# Patient Record
Sex: Male | Born: 1995 | Race: White | Hispanic: No | Marital: Single | Smoking: Former smoker
Health system: Southern US, Community
[De-identification: ages and names within clinical notes are randomized; demographics above are authoritative.]

---
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
## 2018-08-31 ENCOUNTER — Emergency Department
Admission: EM | Admit: 2018-08-31 | Discharge: 2018-08-31 | Disposition: A | Discharge status: Home / Self Care | Payer: Self-pay | Attending: Emergency Medicine | Admitting: Emergency Medicine

## 2018-08-31 ENCOUNTER — Other Ambulatory Visit: Payer: Self-pay

## 2018-08-31 DIAGNOSIS — Z5321 Procedure and treatment not carried out due to patient leaving prior to being seen by health care provider: Secondary | ICD-10-CM | POA: Insufficient documentation

## 2018-08-31 DIAGNOSIS — S01511A Laceration without foreign body of lip, initial encounter: Secondary | ICD-10-CM | POA: Insufficient documentation

## 2018-08-31 DIAGNOSIS — S01111A Laceration without foreign body of right eyelid and periocular area, initial encounter: Secondary | ICD-10-CM | POA: Insufficient documentation

## 2018-08-31 DIAGNOSIS — Y998 Other external cause status: Secondary | ICD-10-CM | POA: Insufficient documentation

## 2018-08-31 DIAGNOSIS — Y9389 Activity, other specified: Secondary | ICD-10-CM | POA: Insufficient documentation

## 2018-08-31 DIAGNOSIS — Y929 Unspecified place or not applicable: Secondary | ICD-10-CM | POA: Insufficient documentation

## 2018-08-31 NOTE — ED Notes (Signed)
Spoke with Dr. Forbach regarding patient, no orders at this time. 

## 2018-08-31 NOTE — ED Notes (Signed)
Pt ambulatory from department waiting area, followed by friends. This rn asked friends if pt was leaving, friends state "I think so, we're gonna find out".
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
## 2019-06-23 ENCOUNTER — Other Ambulatory Visit: Payer: Self-pay

## 2019-06-23 DIAGNOSIS — Z5321 Procedure and treatment not carried out due to patient leaving prior to being seen by health care provider: Secondary | ICD-10-CM | POA: Insufficient documentation

## 2019-06-23 DIAGNOSIS — J029 Acute pharyngitis, unspecified: Secondary | ICD-10-CM | POA: Insufficient documentation

## 2019-06-23 LAB — GROUP A STREP BY PCR: Group A Strep by PCR: DETECTED — AB

## 2019-06-23 NOTE — ED Triage Notes (Signed)
Pt arrives to ED via POV from home with c/o sore throat x1 day. Pt denies any c/o fever, no N/V/D. Pt denies taking any OTC medications PTA. Pt denies any c/o trouble swallowing, breathing, or managing oral secretions. Pt smells HEAVILY of marijuana smoke; RR even, regular, and unlabored.

## 2019-06-24 ENCOUNTER — Telehealth: Payer: Self-pay | Admitting: Emergency Medicine

## 2019-06-24 ENCOUNTER — Emergency Department
Admission: EM | Admit: 2019-06-24 | Discharge: 2019-06-24 | Disposition: A | Discharge status: Home / Self Care | Payer: Self-pay | Attending: Emergency Medicine | Admitting: Emergency Medicine

## 2019-06-24 NOTE — ED Notes (Signed)
No answer when called several times from lobby 

## 2019-06-24 NOTE — Telephone Encounter (Signed)
Called patient due to lwot to inquire about condition and follow up plans.  No answer and no voicemail.  His strep is positive.

## 2019-11-18 ENCOUNTER — Telehealth: Payer: Self-pay

## 2019-11-18 ENCOUNTER — Other Ambulatory Visit: Payer: Self-pay

## 2019-11-18 ENCOUNTER — Ambulatory Visit: Payer: Self-pay

## 2019-11-18 NOTE — Telephone Encounter (Signed)
TC from patient.  Requesting appt for tx. GF treated this am for chlamydia.  Appt scheduled Richmond Campbell, RN

## 2019-11-19 ENCOUNTER — Ambulatory Visit: Payer: Self-pay | Admitting: Family Medicine

## 2019-11-19 ENCOUNTER — Ambulatory Visit: Payer: Self-pay

## 2019-11-19 ENCOUNTER — Encounter: Payer: Self-pay | Admitting: Family Medicine

## 2019-11-19 DIAGNOSIS — Z202 Contact with and (suspected) exposure to infections with a predominantly sexual mode of transmission: Secondary | ICD-10-CM

## 2019-11-19 DIAGNOSIS — Z113 Encounter for screening for infections with a predominantly sexual mode of transmission: Secondary | ICD-10-CM

## 2019-11-19 LAB — GRAM STAIN

## 2019-11-19 MED ORDER — AZITHROMYCIN 500 MG PO TABS
1000.0000 mg | ORAL_TABLET | Freq: Once | ORAL | Status: AC
  Administered 2019-11-19: 15:00:00 1000 mg via ORAL

## 2019-11-19 NOTE — Progress Notes (Signed)
  [facility] Department STI clinic/screening visit  Subjective:  Nicodemus Denk is a 24 y.o. male being seen today for  Chief Complaint  Patient presents with  . SEXUALLY TRANSMITTED DISEASE     The patient reports they do not have symptoms.   Patient has the following medical conditions:  There are no problems to display for this patient.   HPI  Pt reports he is a contact to chlamydia. He would like treatment and STI screening. Denies symptoms.   See flowsheet for further details and programmatic requirements.    No components found for: HCV  The following portions of the patient's history were reviewed and updated as appropriate: allergies, current medications, past medical history, past social history, past surgical history and problem list.  Objective:  There were no vitals filed for this visit.   Physical Exam Constitutional:      Appearance: Normal appearance.  HENT:     Head: Normocephalic and atraumatic.     Comments: No nits or hair loss    Mouth/Throat:     Mouth: Mucous membranes are moist.     Pharynx: Oropharynx is clear. No oropharyngeal exudate or posterior oropharyngeal erythema.  Pulmonary:     Effort: Pulmonary effort is normal.  Abdominal:     General: Abdomen is flat.     Palpations: Abdomen is soft. There is no hepatomegaly or mass.     Tenderness: There is no abdominal tenderness.  Genitourinary:    Pubic Area: No rash or pubic lice.      Penis: Normal and uncircumcised.      Testes: Normal.     Epididymis:     Right: Normal.     Left: Normal.     Rectum: Normal.  Lymphadenopathy:     Head:     Right side of head: No preauricular or posterior auricular adenopathy.     Left side of head: No preauricular or posterior auricular adenopathy.     Cervical: No cervical adenopathy.     Upper Body:     Right upper body: No supraclavicular or axillary adenopathy.     Left upper body: No supraclavicular or axillary adenopathy.   Lower Body: No right inguinal adenopathy. No left inguinal adenopathy.  Skin:    General: Skin is warm and dry.     Findings: No rash.  Neurological:     Mental Status: He is alert and oriented to person, place, and time.       Assessment and Plan:  Garreth Burnsworth is a 24 y.o. male presenting to the [facility] Department for STI screening   1. Screening examination for venereal disease -Pt without symptoms. Screenings today as below. Treat gram stain per standing order. -Patient does meet criteria for HepB, HepC Screening. Accepts these screenings. -Counseled on warning s/sx and when to seek care. Recommended condom use with all sex and discussed importance of condom use for STI prevention. - Gram stain - Gonococcus culture - HCV [facility] - HBV Antigen/[facility]ibody [facility] - Syphilis Serology, [facility]  2. Chlamydia contact -Treatment today as below.  -Pt counseled regarding medication, including to RTC if vomits < 2 hr after taking medicine. No known allergies to this medication. -Advised no sex for 7 days after both pt and partner completes treatment and encouraged condoms with all sex. - azithromycin (ZITHROMAX) tablet 1,000 mg   Return for screening as needed.  No future appointments.  Ann Held, PA-C

## 2019-11-19 NOTE — Progress Notes (Signed)
Here today for STD screening. Accepts bloodwork. Amaira Safley, RN ° °

## 2019-11-19 NOTE — Progress Notes (Signed)
Gram Stain results reviewed. Patient treated per provider orders. Barclay Lennox, RN  

## 2019-11-23 LAB — GONOCOCCUS CULTURE

## 2019-11-24 LAB — HEPATITIS B SURFACE ANTIGEN

## 2019-11-25 LAB — HM HIV SCREENING LAB: HM HIV Screening: NEGATIVE

## 2019-11-25 LAB — HM HEPATITIS C SCREENING LAB: HM Hepatitis Screen: NEGATIVE

## 2020-12-20 ENCOUNTER — Encounter: Payer: Self-pay | Admitting: Emergency Medicine

## 2020-12-20 ENCOUNTER — Ambulatory Visit
Admission: EM | Admit: 2020-12-20 | Discharge: 2020-12-20 | Disposition: A | Discharge status: Home / Self Care | Payer: BLUE CROSS/BLUE SHIELD | Attending: Physician Assistant | Admitting: Physician Assistant

## 2020-12-20 ENCOUNTER — Other Ambulatory Visit: Payer: Self-pay

## 2020-12-20 DIAGNOSIS — M546 Pain in thoracic spine: Secondary | ICD-10-CM

## 2020-12-20 MED ORDER — CYCLOBENZAPRINE HCL 10 MG PO TABS: 10.0000 mg | ORAL_TABLET | Freq: Three times a day (TID) | ORAL | 0 refills | Status: AC | PRN

## 2020-12-20 MED ORDER — NAPROXEN 500 MG PO TABS: 500.0000 mg | ORAL_TABLET | Freq: Two times a day (BID) | ORAL | 0 refills | Status: AC

## 2020-12-20 NOTE — Discharge Instructions (Signed)
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
## 2021-02-14 ENCOUNTER — Ambulatory Visit: Payer: Self-pay | Admitting: Family Medicine

## 2021-10-21 ENCOUNTER — Emergency Department
Admission: EM | Admit: 2021-10-21 | Discharge: 2021-10-21 | Disposition: A | Discharge status: Home / Self Care | Payer: BLUE CROSS/BLUE SHIELD | Attending: Student in an Organized Health Care Education/Training Program | Admitting: Student in an Organized Health Care Education/Training Program

## 2021-10-21 ENCOUNTER — Emergency Department: Payer: BLUE CROSS/BLUE SHIELD

## 2021-10-21 ENCOUNTER — Other Ambulatory Visit: Payer: Self-pay

## 2021-10-21 ENCOUNTER — Encounter: Payer: Self-pay | Admitting: Emergency Medicine

## 2021-10-21 DIAGNOSIS — M25571 Pain in right ankle and joints of right foot: Secondary | ICD-10-CM

## 2021-10-21 DIAGNOSIS — S0285XA Fracture of orbit, unspecified, initial encounter for closed fracture: Secondary | ICD-10-CM | POA: Diagnosis not present

## 2021-10-21 DIAGNOSIS — S0012XA Contusion of left eyelid and periocular area, initial encounter: Secondary | ICD-10-CM | POA: Diagnosis not present

## 2021-10-21 DIAGNOSIS — S022XXA Fracture of nasal bones, initial encounter for closed fracture: Secondary | ICD-10-CM

## 2021-10-21 DIAGNOSIS — S0992XA Unspecified injury of nose, initial encounter: Secondary | ICD-10-CM | POA: Diagnosis present

## 2021-10-21 DIAGNOSIS — M25471 Effusion, right ankle: Secondary | ICD-10-CM | POA: Diagnosis not present

## 2021-10-21 DIAGNOSIS — R519 Headache, unspecified: Secondary | ICD-10-CM | POA: Insufficient documentation

## 2021-10-21 MED ORDER — AMOXICILLIN-POT CLAVULANATE 875-125 MG PO TABS
1.0000 | ORAL_TABLET | Freq: Once | ORAL | Status: AC
  Administered 2021-10-21: 1 via ORAL
  Filled 2021-10-21: qty 1

## 2021-10-21 MED ORDER — AMOXICILLIN-POT CLAVULANATE 875-125 MG PO TABS: 1.0000 | ORAL_TABLET | Freq: Two times a day (BID) | ORAL | 0 refills | Status: AC

## 2021-10-21 MED ORDER — OXYCODONE-ACETAMINOPHEN 5-325 MG PO TABS: 1.0000 | ORAL_TABLET | ORAL | 0 refills | Status: AC | PRN

## 2021-10-21 MED ORDER — FLUORESCEIN SODIUM 1 MG OP STRP
1.0000 | ORAL_STRIP | Freq: Once | OPHTHALMIC | Status: AC
  Administered 2021-10-21: 1 via OPHTHALMIC
  Filled 2021-10-21: qty 1

## 2021-10-21 MED ORDER — OXYCODONE-ACETAMINOPHEN 5-325 MG PO TABS
1.0000 | ORAL_TABLET | Freq: Once | ORAL | Status: AC
  Administered 2021-10-21: 1 via ORAL
  Filled 2021-10-21: qty 1
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
## 2023-10-22 ENCOUNTER — Ambulatory Visit
Admission: EM | Admit: 2023-10-22 | Discharge: 2023-10-22 | Disposition: A | Discharge status: Home / Self Care | Payer: Self-pay | Attending: Physician Assistant | Admitting: Physician Assistant

## 2023-10-22 DIAGNOSIS — Z113 Encounter for screening for infections with a predominantly sexual mode of transmission: Secondary | ICD-10-CM | POA: Insufficient documentation

## 2023-10-22 NOTE — ED Triage Notes (Signed)
 Pt presents to UC for STD testing. Denies any active sx's.

## 2023-10-22 NOTE — ED Provider Notes (Signed)
 [facility]    CSN: [id number] Arrival date & time: 10/22/23  1447      History   Chief Complaint Chief Complaint  Patient presents with   SEXUALLY TRANSMITTED DISEASE    HPI Adam Cook is a 28 y.o. male presenting for STI screening.  Denies dysuria, urethral discharge, testicular pain or swelling, rashes or genital lesions.  Patient is sexually active with females and says use condoms.  Denies any known STIs that he has been exposed to.  Last STI screening was a couple months ago and everything was negative.  HPI  History reviewed. No pertinent past medical history.  There are no active problems to display for this patient.   History reviewed. No pertinent surgical history.     Home Medications    Prior to Admission medications   Not on File    Family History History reviewed. No pertinent family history.  Social History Social History   Tobacco Use   Smoking status: Former    Current packs/day: 0.10    Types: Cigarettes   Smokeless tobacco: Never  Vaping Use   Vaping status: Never Used  Substance Use Topics   Alcohol use: Yes    Comment: 1 pint/wk   Drug use: Yes    Frequency: 3.0 times per week    Types: Marijuana     Allergies   Peanut oil   Review of Systems Review of Systems  Constitutional:  Negative for fatigue and fever.  Gastrointestinal:  Negative for abdominal pain, nausea and vomiting.  Genitourinary:  Negative for dysuria, frequency, genital sores, hematuria, penile discharge, penile pain, penile swelling, scrotal swelling, testicular pain and urgency.  Musculoskeletal:  Negative for arthralgias.  Skin:  Negative for rash.  Neurological:  Negative for weakness.     Physical Exam Triage Vital Signs ED Triage Vitals  Encounter Vitals Group     BP 10/22/23 1613 (!) 144/77     Systolic BP Percentile --      Diastolic BP Percentile --      Pulse Rate 10/22/23 1613 (!) 48     Resp 10/22/23 1613 16     Temp  10/22/23 1613 97.9 F (36.6 C)     Temp Source 10/22/23 1613 Oral     SpO2 10/22/23 1613 98 %     Weight 10/22/23 1613 190 lb (86.2 kg)     Height 10/22/23 1613 5\' 10"  (1.778 m)     Head Circumference --      Peak Flow --      Pain Score 10/22/23 1616 0     Pain Loc --      Pain Education --      Exclude from Growth Chart --    No data found.  Updated Vital Signs BP (!) 144/77 (BP Location: Right Arm)   Pulse (!) 48   Temp 97.9 F (36.6 C) (Oral)   Resp 16   Ht 5\' 10"  (1.778 m)   Wt 190 lb (86.2 kg)   SpO2 98%   BMI 27.26 kg/m     Physical Exam Vitals and nursing note reviewed.  Constitutional:      General: He is not in acute distress.    Appearance: Normal appearance. He is well-developed. He is not ill-appearing.  HENT:     Head: Normocephalic and atraumatic.  Eyes:     General: No scleral icterus.    Conjunctiva/sclera: Conjunctivae normal.  Cardiovascular:     Rate and Rhythm: Bradycardia present.  Pulmonary:     Effort: Pulmonary effort is normal. No respiratory distress.  Musculoskeletal:     Cervical back: Neck supple.  Skin:    General: Skin is warm and dry.     Capillary Refill: Capillary refill takes less than 2 seconds.  Neurological:     General: No focal deficit present.     Mental Status: He is alert. Mental status is at baseline.     Motor: No weakness.     Gait: Gait normal.  Psychiatric:        Mood and Affect: Mood normal.      UC Treatments / Results  Labs (all labs ordered are listed, but only abnormal results are displayed) Labs Reviewed  CYTOLOGY, (ORAL, ANAL, URETHRAL) ANCILLARY ONLY    EKG   Radiology No results found.  Procedures Procedures (including critical care time)  Medications Ordered in UC Medications - No data to display  Initial Impression / Assessment and Plan / UC Course  I have reviewed the triage vital signs and the nursing notes.  Pertinent labs & imaging results that were available during my  care of the patient were reviewed by me and considered in my medical decision making (see chart for details).   28 year old male presents for STI screening.  Denies known exposure to STI.  Denies any symptoms.  Patient elects to perform self swab for GC/chlamydia/trichomonas.  Will treat if results are positive since he is asymptomatic at this time.  Patient has access to MyChart and will review results on there.  He declines an AVS.   Final Clinical Impressions(s) / UC Diagnoses   Final diagnoses:  Routine screening for STI (sexually transmitted infection)   Discharge Instructions   None    ED Prescriptions   None    PDMP not reviewed this encounter.   Shirlee Latch, PA-C 10/22/23 [phone]

## 2023-10-23 LAB — CYTOLOGY, (ORAL, ANAL, URETHRAL) ANCILLARY ONLY
Chlamydia: NEGATIVE
Comment: NEGATIVE
Comment: NEGATIVE
Comment: NORMAL
Neisseria Gonorrhea: NEGATIVE
Trichomonas: NEGATIVE
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
## 2023-11-07 IMAGING — CR DG ANKLE COMPLETE 3+V*R*
3 series · 3 of 3 positions shown · non-contrast
Comparison: None.

CLINICAL DATA: ankle injury and pain

EXAM:
RIGHT ANKLE - COMPLETE 3+ VIEW

[ankle ap]
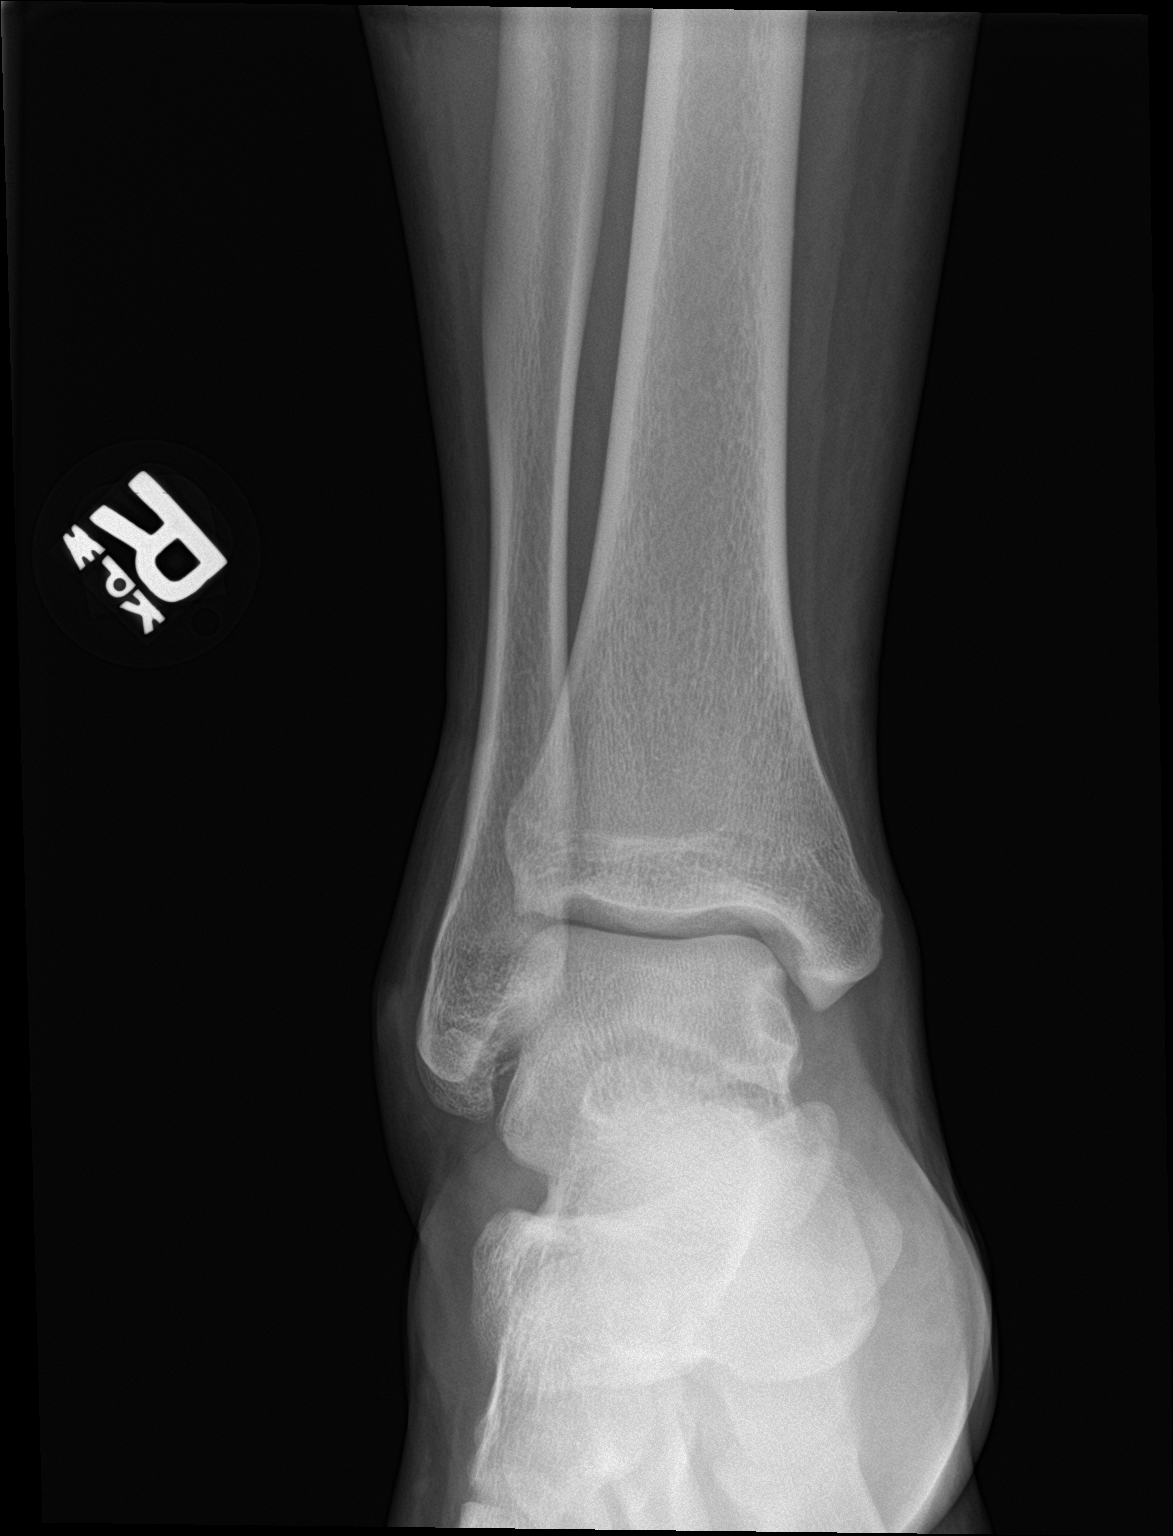

[ankle obl]
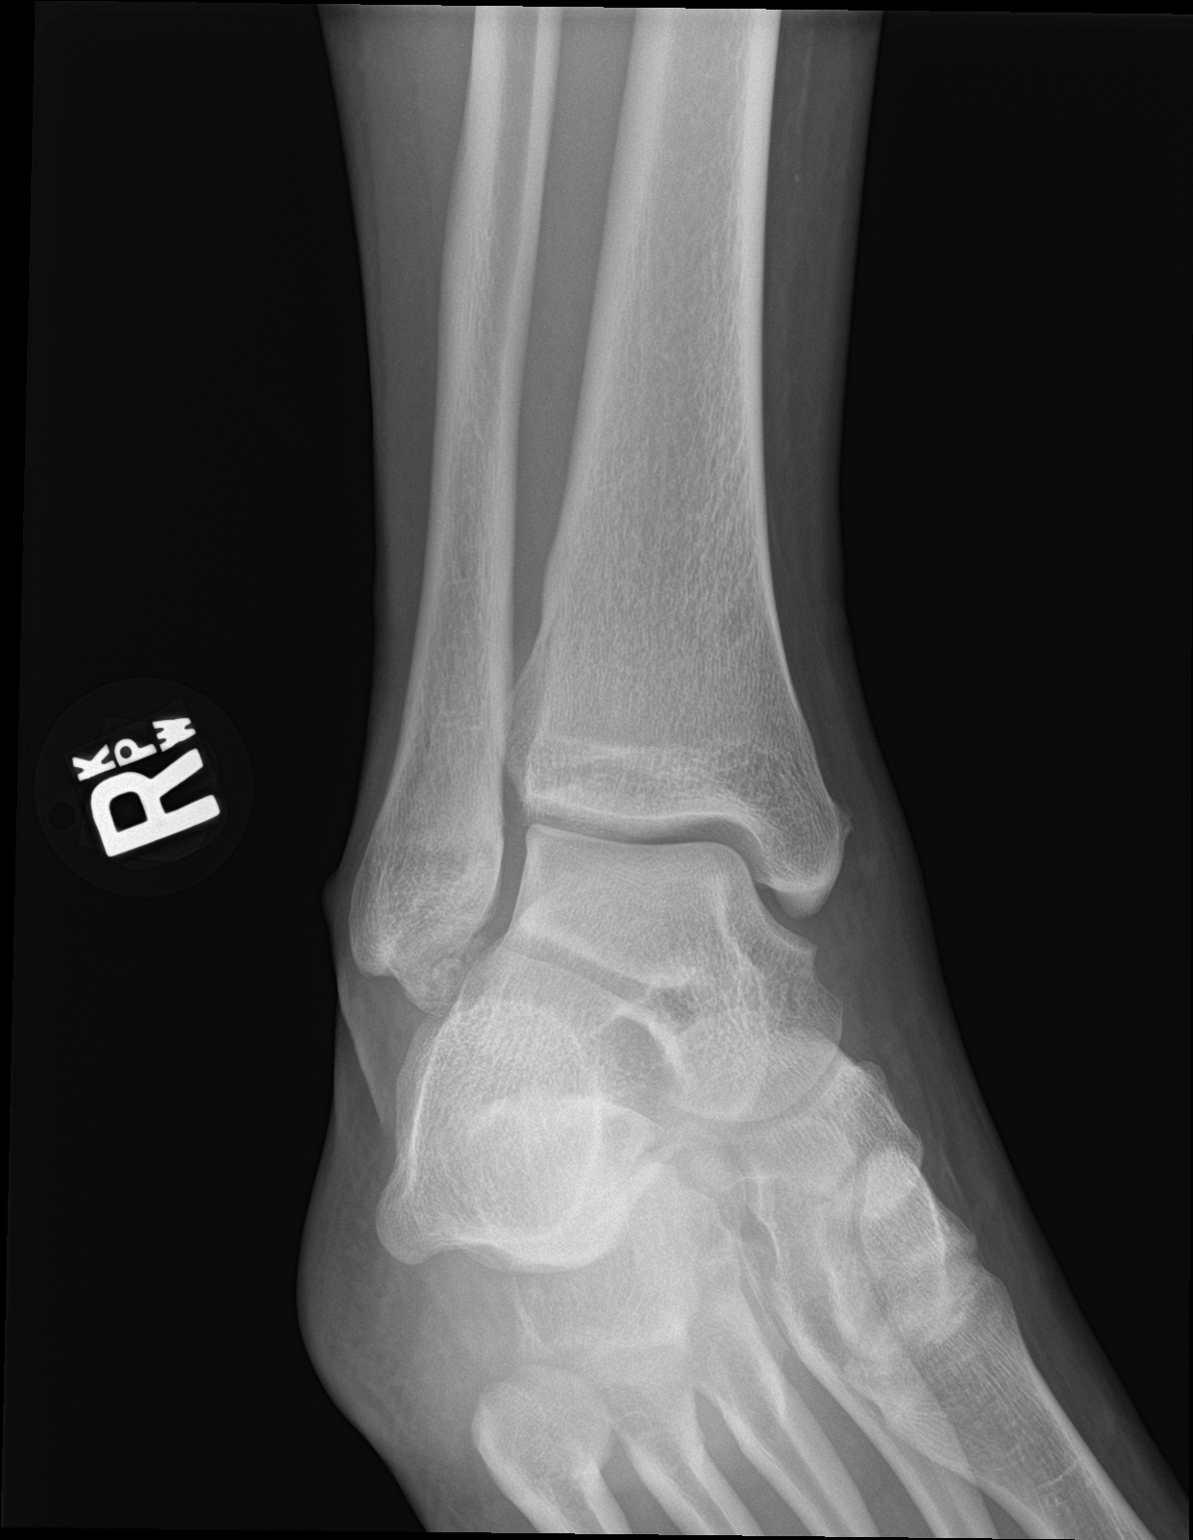

[ankle lat]
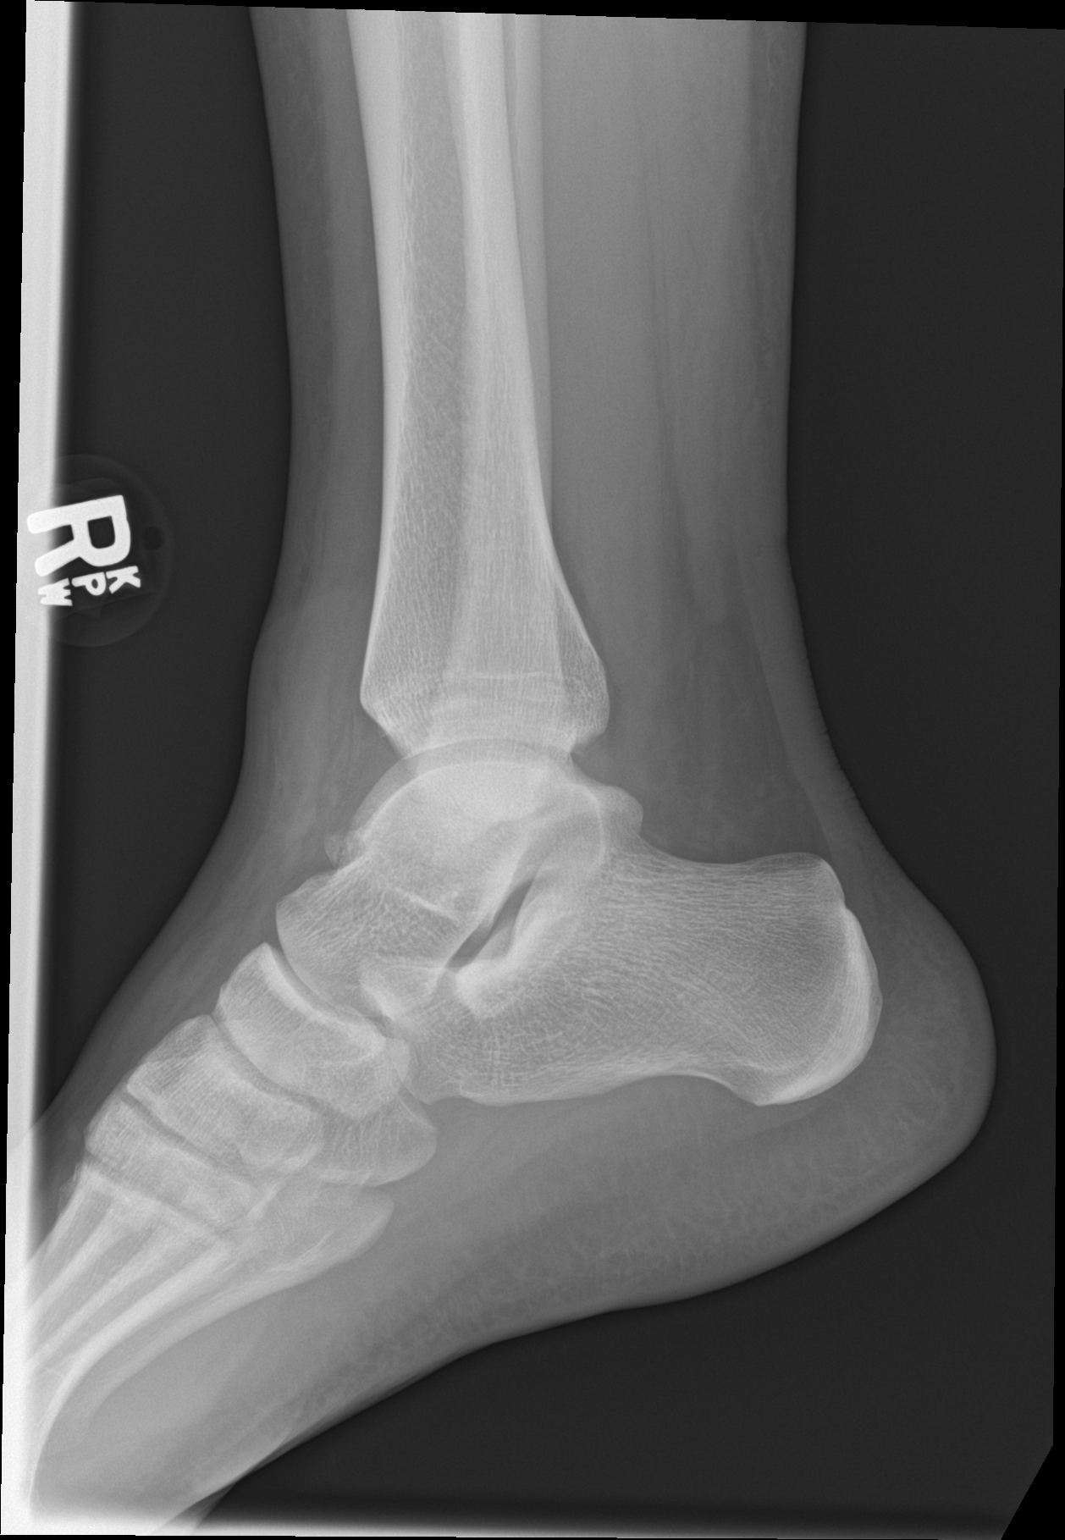

[3 of 3 positions shown; findings below may reference images not displayed]

FINDINGS: There is soft tissue swelling of the ankle prominent laterally and
dorsal/anteriorly. Small tibiotalar joint effusion. Tiny bone
fragment along the dorsal aspect of the talar neck, and faint linear
lucency coursing through the talus.

There is irregular but well corticated bone fragment at the tip of
the lateral malleolus. There is a faint linear density along the
inner aspect on the AP view.
IMPRESSION: Possible nondisplaced fracture of the talus. Ankle soft tissue
swelling. CT would be useful for further evaluation.

Irregular well corticated bone fragment at the tip of the lateral
malleolus, likely sequela of old injury. The possibility of an acute
component could also be assessed with CT.
